# Patient Record
Sex: Male | Born: 1995 | Race: Black or African American | Hispanic: No | Marital: Single | Smoking: Former smoker
Health system: Southern US, Community
[De-identification: ages and names within clinical notes are randomized; demographics above are authoritative.]

---
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
## 2017-02-28 ENCOUNTER — Ambulatory Visit (HOSPITAL_COMMUNITY)
Admission: EM | Admit: 2017-02-28 | Discharge: 2017-02-28 | Disposition: A | Discharge status: Home / Self Care | Payer: PRIVATE HEALTH INSURANCE | Attending: Family Medicine | Admitting: Family Medicine

## 2017-02-28 ENCOUNTER — Encounter (HOSPITAL_COMMUNITY): Payer: Self-pay | Admitting: *Deleted

## 2017-02-28 DIAGNOSIS — H60502 Unspecified acute noninfective otitis externa, left ear: Secondary | ICD-10-CM

## 2017-02-28 DIAGNOSIS — H6690 Otitis media, unspecified, unspecified ear: Secondary | ICD-10-CM | POA: Diagnosis not present

## 2017-02-28 MED ORDER — NEOMYCIN-POLYMYXIN-HC 3.5-10000-1 OT SOLN: 3.0000 [drp] | Freq: Four times a day (QID) | OTIC | 0 refills | Status: AC

## 2017-02-28 MED ORDER — AMOXICILLIN 500 MG PO CAPS: 1000.0000 mg | ORAL_CAPSULE | Freq: Two times a day (BID) | ORAL | 0 refills | Status: AC

## 2017-02-28 NOTE — ED Provider Notes (Signed)
CSN: [id number]     Arrival date & time 02/28/17  1148 History   First MD Initiated Contact with Patient 02/28/17 1306     Chief Complaint  Patient presents with  . Otalgia   (Consider location/radiation/quality/duration/timing/severity/associated sxs/prior Treatment) 21 year old male states he has been having left ear discomfort for about 2 weeks. The last few days is getting to have pain. He has been using hydrogen peroxide in his left ear. He notes that his hearing is decreased in the left ear. No known trauma. No fever.      History reviewed. No pertinent past medical history. History reviewed. No pertinent surgical history. History reviewed. No pertinent family history. Social History  Substance Use Topics  . Smoking status: Former Games developer  . Smokeless tobacco: Not on file  . Alcohol use Yes    Review of Systems  Constitutional: Negative.   HENT: Positive for ear discharge and ear pain. Negative for congestion, facial swelling and tinnitus.   Eyes: Negative.   Respiratory: Negative.   Neurological: Negative.   All other systems reviewed and are negative.   Allergies  Patient has no known allergies.  Home Medications   Prior to Admission medications   Medication Sig Start Date End Date Taking? Authorizing Provider  amoxicillin (AMOXIL) 500 MG capsule Take 2 capsules (1,000 mg total) by mouth 2 (two) times daily. 02/28/17   Hayden Rasmussen, NP  neomycin-polymyxin-hydrocortisone (CORTISPORIN) otic solution Place 3 drops into the left ear 4 (four) times daily. 02/28/17   Hayden Rasmussen, NP   Meds Ordered and Administered this Visit  Medications - No data to display  BP 129/79 (BP Location: Left Arm)   Pulse (!) 55 Comment: notified rn  Temp 98.5 F (36.9 C) (Oral)   Resp 16   SpO2 100%  No data found.   Physical Exam  Constitutional: He is oriented to person, place, and time. He appears well-developed and well-nourished. No distress.  HENT:  Mouth/Throat: Oropharynx  is clear and moist.  Right EAC is clear and TMs appear normal.   Left EAC obstructed with hair what appears to be quite the pre-and some wire ask. No moisture, erythema or drainage present.  Eyes: EOM are normal.  Cardiovascular: Normal rate.   Pulmonary/Chest: Effort normal.  Musculoskeletal: Normal range of motion.  Neurological: He is alert and oriented to person, place, and time.  Skin: Skin is warm and dry.  Psychiatric: He has a normal mood and affect.  Nursing note and vitals reviewed.   Urgent Care Course     Procedures (including critical care time)  Labs Review Labs Reviewed - No data to display  Imaging Review No results found.   Visual Acuity Review  Right Eye Distance:   Left Eye Distance:   Bilateral Distance:    Right Eye Near:   Left Eye Near:    Bilateral Near:         MDM   1. Acute otitis media, unspecified otitis media type   2. Acute otitis externa of left ear, unspecified type    Post irrigation of the left ear reveals that most of the debris and cerumen was removed. There is minor erythema and tenderness to the ear canal. There is deep erythema and dullness to the TM. This erythema is a little more dramatic than that associated with typical ear irrigation. Meds ordered this encounter  Medications  . amoxicillin (AMOXIL) 500 MG capsule    Sig: Take 2 capsules (1,000 mg total) by mouth 2 (  two) times daily.    Dispense:  32 capsule    Refill:  0    Order Specific Question:   Supervising Provider    Answer:   Elvina SidleLAUENSTEIN, KURT [[id number]]  . neomycin-polymyxin-hydrocortisone (CORTISPORIN) otic solution    Sig: Place 3 drops into the left ear 4 (four) times daily.    Dispense:  10 mL    Refill:  0    Order Specific Question:   Supervising Provider    Answer:   Elvina SidleLAUENSTEIN, KURT [[id number]]       Hayden RasmussenMabe, Fayrene Towner, NP 02/28/17 1400

## 2017-02-28 NOTE — Discharge Instructions (Signed)
Your be given eardrops for infection of the ear canal as well as antibiotics for what appears to be a middle ear infection. Take all of the antibiotics to go home. Ibuprofen 600 mg every 6 hours as needed for pain. May also apply warm moist heat next to the ear for comfort.

## 2017-02-28 NOTE — ED Triage Notes (Signed)
l  Earache   X  2   Weeks         Some   Drainage   Present        Used  Peroxide

## 2019-07-29 ENCOUNTER — Encounter (HOSPITAL_COMMUNITY): Payer: Self-pay

## 2019-07-29 ENCOUNTER — Other Ambulatory Visit: Payer: Self-pay

## 2019-07-29 ENCOUNTER — Ambulatory Visit (INDEPENDENT_AMBULATORY_CARE_PROVIDER_SITE_OTHER): Payer: PRIVATE HEALTH INSURANCE

## 2019-07-29 ENCOUNTER — Ambulatory Visit (HOSPITAL_COMMUNITY)
Admission: EM | Admit: 2019-07-29 | Discharge: 2019-07-29 | Disposition: A | Discharge status: Home / Self Care | Payer: PRIVATE HEALTH INSURANCE | Attending: Family Medicine | Admitting: Family Medicine

## 2019-07-29 DIAGNOSIS — S161XXA Strain of muscle, fascia and tendon at neck level, initial encounter: Secondary | ICD-10-CM

## 2019-07-29 DIAGNOSIS — S39012A Strain of muscle, fascia and tendon of lower back, initial encounter: Secondary | ICD-10-CM

## 2019-07-29 MED ORDER — IBUPROFEN 800 MG PO TABS
ORAL_TABLET | ORAL | Status: AC
  Filled 2019-07-29: qty 1

## 2019-07-29 MED ORDER — IBUPROFEN 800 MG PO TABS
800.0000 mg | ORAL_TABLET | Freq: Once | ORAL | Status: AC
  Administered 2019-07-29: 11:00:00 800 mg via ORAL

## 2019-07-29 MED ORDER — NAPROXEN 500 MG PO TABS: 500.0000 mg | ORAL_TABLET | Freq: Two times a day (BID) | ORAL | 0 refills | Status: AC

## 2019-07-29 MED ORDER — CYCLOBENZAPRINE HCL 5 MG PO TABS: 5.0000 mg | ORAL_TABLET | Freq: Every day | ORAL | 0 refills | Status: AC
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
